# Patient Record
Sex: Female | Born: 1965 | Race: White | Hispanic: No | Marital: Married | Smoking: Never smoker
Health system: Southern US, Community
[De-identification: ages and names within clinical notes are randomized; demographics above are authoritative.]

## PROBLEM LIST (undated history)

## (undated) DIAGNOSIS — E785 Hyperlipidemia, unspecified: Secondary | ICD-10-CM

## (undated) HISTORY — PX: TMJ ARTHROSCOPY: SHX1067

## (undated) HISTORY — PX: ABDOMINAL HYSTERECTOMY: SHX81

---

## 2000-06-10 ENCOUNTER — Other Ambulatory Visit: Admission: RE | Admit: 2000-06-10 | Discharge: 2000-06-10 | Payer: Self-pay | Admitting: Obstetrics & Gynecology

## 2000-10-24 ENCOUNTER — Encounter (INDEPENDENT_AMBULATORY_CARE_PROVIDER_SITE_OTHER): Payer: Self-pay | Admitting: Specialist

## 2000-10-24 ENCOUNTER — Ambulatory Visit (HOSPITAL_BASED_OUTPATIENT_CLINIC_OR_DEPARTMENT_OTHER): Admission: RE | Admit: 2000-10-24 | Discharge: 2000-10-24 | Payer: Self-pay | Admitting: Surgery

## 2001-11-10 ENCOUNTER — Other Ambulatory Visit: Admission: RE | Admit: 2001-11-10 | Discharge: 2001-11-10 | Payer: Self-pay | Admitting: Obstetrics & Gynecology

## 2003-03-30 ENCOUNTER — Other Ambulatory Visit: Admission: RE | Admit: 2003-03-30 | Discharge: 2003-03-30 | Payer: Self-pay | Admitting: Obstetrics & Gynecology

## 2004-05-09 ENCOUNTER — Other Ambulatory Visit: Admission: RE | Admit: 2004-05-09 | Discharge: 2004-05-09 | Payer: Self-pay | Admitting: Obstetrics & Gynecology

## 2005-06-12 ENCOUNTER — Other Ambulatory Visit: Admission: RE | Admit: 2005-06-12 | Discharge: 2005-06-12 | Payer: Self-pay | Admitting: Obstetrics & Gynecology

## 2007-05-06 ENCOUNTER — Ambulatory Visit (HOSPITAL_COMMUNITY): Admission: RE | Admit: 2007-05-06 | Discharge: 2007-05-06 | Payer: Self-pay | Admitting: Obstetrics and Gynecology

## 2007-05-13 ENCOUNTER — Ambulatory Visit: Admission: RE | Admit: 2007-05-13 | Discharge: 2007-05-13 | Payer: Self-pay | Admitting: Gynecology

## 2007-05-20 ENCOUNTER — Inpatient Hospital Stay (HOSPITAL_COMMUNITY): Admission: RE | Admit: 2007-05-20 | Discharge: 2007-05-22 | Payer: Self-pay | Admitting: Obstetrics & Gynecology

## 2007-05-20 ENCOUNTER — Encounter: Payer: Self-pay | Admitting: Gynecology

## 2009-02-04 ENCOUNTER — Ambulatory Visit: Payer: Self-pay | Admitting: Diagnostic Radiology

## 2009-02-04 ENCOUNTER — Ambulatory Visit (HOSPITAL_BASED_OUTPATIENT_CLINIC_OR_DEPARTMENT_OTHER): Admission: RE | Admit: 2009-02-04 | Discharge: 2009-02-04 | Payer: Self-pay | Admitting: Family Medicine

## 2011-01-21 ENCOUNTER — Encounter: Payer: Self-pay | Admitting: Obstetrics and Gynecology

## 2011-05-15 NOTE — Op Note (Signed)
NAME:  Mary Zimmerman, Mary Zimmerman             ACCOUNT NO.:  [id number]   MEDICAL RECORD NO.:  [id number]          PATIENT TYPE:  INP   LOCATION:  0008                         FACILITY:  [facility]   PHYSICIAN:  De Blanch, M.D.DATE OF BIRTH:  [birth year]   DATE OF PROCEDURE:  05/20/2007  DATE OF DISCHARGE:                               OPERATIVE REPORT   PREOPERATIVE DIAGNOSES:  Complex adnexal mass, elevated CA125.   POSTOPERATIVE DIAGNOSES:  Endometriosis with bilateral ovarian  endometriomas, uterine fibroids.   OPERATIVE PROCEDURE:  Total abdominal hysterectomy, bilateral salpingo-  oophorectomy.   SURGEON:  De Blanch, M.D.   ASSISTANT:  Gerrit Friends. Aldona Bar, M.D.  Telford Nab, R.N.   ANESTHESIA:  General with orotracheal tube.   ESTIMATED BLOOD LOSS:  100 mL.   SURGICAL FINDINGS:  At the time of exploratory laparotomy, the patient  had bilateral endometriomas.  The left endometrioma was slightly  adherent to the left pelvic side wall.  The uterus is retroverted and  had small uterine fibroids.  There were additional adhesions in the  posterior cul-de-sac.  Frozen section returned showing these to be  bilateral endometriomas with no evidence of malignancy.   The upper abdomen was normal on palpation.   DESCRIPTION OF PROCEDURE:  The patient was brought to the operating room  and after satisfactory attainment of general anesthesia, was placed in  the modified lithotomy position in Agnew stirrups.  The anterior  abdominal wall, perineum and vagina were prepped with Betadine, a Foley  catheter was inserted and the patient was draped.  The abdomen was  entered through a Pfannenstiel incision.  Peritoneal washings were  obtained from the pelvis.  The upper abdomen was explored with the above  noted findings.  Bookwalter retractor was positioned.  Small bowel was  packed out of the pelvis.  The left ovarian cyst was most apparent and  therefore the left pelvic side  wall was opened, identifying the vessels  and ureter.  The ovarian vessels were skeletonized, clamped, cut, free  tied and suture ligated with 2-0 Vicryl suture.  The broad ligament was  incised above the ureter down to the uterus.  The fallopian tube and  ovarian ligament were cross clamped and then transected.  The tube and  ovary on the left were submitted for frozen section.  The uterus was  grasped with Tresa Endo clamp.  The right round ligament was divided and  right retroperitoneal space opened.  The right ovarian vessels were  skeletonized, clamped, cut, free tied and suture ligated.  In a similar  fashion the fallopian tube and ovarian ligament were cross clamped and  divided and the right tube and ovary handed off the operative field and  submitted to frozen section.  The bladder flap was advanced with sharp  and blunt dissection.  The uterine vessels were skeletonized, clamped,  cut and suture ligated.  The rectovaginal septum was incised in order to  mobilize the cervix further.  Then in a step wise fashion the  paracervical and cardinal ligaments were clamped, cut and suture  ligated.  The vaginal angles were cross clamped and the  vagina  transected from its connection to the cervix.  The vaginal angles were  transfixed with 0 Vicryl and the central portion of the vagina closed  with interrupted figure-of-eight sutures of 0 Vicryl.  The pelvis was  irrigated and reinspected and found to be hemostatic.  Packs and  retractors were removed.  The parietoperitoneum was closed using running  suture of 2-0 Vicryl suture.  The fascia was closed with a running  suture of 0 PDS.  Subcutaneous tissue was irrigated, hemostasis achieved  with cautery.  Skin was closed with a running subcuticular suture of 4-0  Vicryl.  A dressing was applied.  The patient was awakened from  anesthesia and taken to the recovery room in satisfactory condition.  Sponge, needle and instrument count correct  x2.      De Blanch, M.D.  Electronically Signed     DC/MEDQ  D:  05/20/2007  T:  05/20/2007  Job:  [id number]   cc:   Gerrit Friends. Aldona Bar, M.D.  Fax: [phone]   Telford Nab, R.N.  [address]  [place], [place] [address]

## 2011-05-15 NOTE — Consult Note (Signed)
NAME:  Mary Zimmerman, Mary Zimmerman             ACCOUNT NO.:  [id number]   MEDICAL RECORD NO.:  [id number]          PATIENT TYPE:  OUT   LOCATION:  GYN                          FACILITY:  [facility]   PHYSICIAN:  De Blanch, M.D.DATE OF BIRTH:  [birth year]   DATE OF CONSULTATION:  05/13/2007  DATE OF DISCHARGE:                                 CONSULTATION   ONCOLOGY CLINIC   CHIEF COMPLAINT:  Pelvic mass and low back pain.   HISTORY OF PRESENT ILLNESS:  This is a 45 year old white married female  seen in consultation at the request of Dr. Annamaria Helling regarding  management of a newly diagnosed pelvic mass.  The patient developed a  constellation of symptoms which were vague yet led her to worry that she  might have ovarian cancer.  These symptoms included low back pain for  the past two months.  This is especially located on the left.  Gas,  fatigue and epigastric pressure.   The patient has no other significant gynecologic history and has regular  menstrual periods.  Only symptoms associated with menstrual periods are  headaches and occasional dysmenorrhea.  The patient's evaluations  include a CT scan which shows an 11 cm cystic pelvic mass and a small  amount of free fluid.  There is no evidence of adenopathy.  She does  have a CA-125 which is 94 units/ml.   PAST MEDICAL HISTORY:  Medical illnesses:  None.   PAST SURGICAL HISTORY:  Knee reconstruction, excision of cervical and  axillary lymph nodes and resection of the tail of Spence in the right  axilla.   ALLERGIES:  Drug allergies:  Possibly penicillin (this is uncertain).   CURRENT MEDICATIONS:  Ponstel p.r.n. pain.   SOCIAL HISTORY:  The patient is married.  She does not smoke.  She works  in a bank.   Her primary hobby is horseback riding.   OBSTETRICAL HISTORY:  Gravida 2.  The patient has an 25 and a 16-year-  old.  The 29 year old daughter is beginning [employer] [address] in the  summer.   FAMILY HISTORY:  The  patient's mother died of breast cancer at age 56.  She also had a great aunt who had breast cancer and a maternal aunt who  had colon cancer in her 9s.   REVIEW OF SYSTEMS:  Ten point comprehensive review of systems is  negative except as noted above.   PHYSICAL EXAMINATION:  Height 5 foot 8, weight 140 pounds.  Blood  pressure 110/70.  Pulse 80.  Respiratory rate 20.  In general the  patient is a healthy, slender, white female in no acute distress.  HEENT  is negative.  Neck is supple without thyromegaly.  There is no  supraclavicular or inguinal adenopathy.  The abdomen is soft and  nontender.  There is a palpable mass in the suprapubic region arising  approximately 3-4 cm above the pubis.  Pelvic exam:  EG/BUS, vagina and  bladder urethra are normal.  Cervix is anterior, normal shape, size and  consistency.  On bimanual examination the uterus is anterior and then  behind the uterus and  to the left is an approximately 8-10 cm cystic  mass which is minimally tender.  Rectovaginal exam confirms.   IMPRESSION:  Complex pelvic mass with an elevated CA-125.  I would  recommend the patient undergo exploratory laparotomy with resection of  the mass with intraoperative frozen section to determine the histologic  nature.   I had a lengthy discussion with the patient and her husband regarding  the possibilities.  If this is benign, the patient wishes to preserve  her other ovary and uterus.  On the other hand, if it is malignant she  understands we will undertake a TAH BSO, omentectomy, pelvic inferior  lymphadenectomy, multiple peritoneal biopsies and washings.   We have agreed to proceed through a Pfannenstiel incision, although the  patient understands that should I need additional room to accomplish  surgical procedures as appropriate, we may also add a midline incision  if necessary.  The risks of surgery including hemorrhage, infection,  injury to adjacent viscera, thromboembolic  complications and anesthetic  risks are outlined.  The patient wished to go ahead with surgery and we  will coordinate the surgery with Dr. Annamaria Helling.      De Blanch, M.D.  Electronically Signed     DC/MEDQ  D:  05/13/2007  T:  05/13/2007  Job:  [id number]   cc:   Gerrit Friends. Aldona Bar, M.D.  Fax: [phone]   Telford Nab, R.N.  [address]  [place], [place] [address]

## 2011-05-15 NOTE — Discharge Summary (Signed)
NAMELEIANN, SPORER             ACCOUNT NO.:  [id number]   MEDICAL RECORD NO.:  [id number]          PATIENT TYPE:  INP   LOCATION:  1526                         FACILITY:  [facility]   PHYSICIAN:  Gerrit Friends. Aldona Bar, M.D.   DATE OF BIRTH:  04-17-1966   DATE OF ADMISSION:  05/20/2007  DATE OF DISCHARGE:  05/22/2007                               DISCHARGE SUMMARY   DISCHARGE DIAGNOSES:  1. Severe pelvic endometriosis with bilateral endometriomas.  2. Mucinous cystadenoma, left ovary.   PROCEDURE:  Total abdominal hysterectomy, bilateral salpingo-  oophorectomy and lysis of adhesions.   HISTORY:  This 45 year old female was seen by my physicians assistant in  early May with pelvic pain and was found to have a large pelvic mass  consistent with an enlarged ovary.  Ultimately, she had a CA-125 and a  CT and was referred to Dr. De Blanch for evaluation and  consideration for surgery.  After a normal preoperative workup she was  taken to the operating room by Dr. De Blanch on May 20, 2007, at which time she was found to have severe pelvic endometriosis -  relatively compatible with her past history - and underwent a total  abdominal hysterectomy, bilateral salpingo-oophorectomy and lysis of  adhesions.  Frozen section was done, revealing bilateral endometriomas  and on final sections, an additional mucinous cystadenoma of the left  ovary was discovered.   The patient's postoperative course was totally benign.  She remained  afebrile during her postop course.  Her discharge hemoglobin was 8.9  with a white count of 12,600 and a platelet count of 216,000  (preoperative hemoglobin was 10.8).   As mentioned, the patient's hospital course was benign.  On the morning  of May 22, 2007 she was ambulating well, tolerating a regular diet well,  having normal bowel and bladder function, was afebrile.  Her wound was  clean and dry and she was very comfortable with her oral  analgesia.   She was given all appropriate instructions on the morning of May 22, 2007 and discharged to home.  She will be seen by me in the office in  approximately 3 weeks' time, or as needed.   DISCHARGE MEDICATIONS:  1. Dilaudid 2 mg 1 every 3 hours as needed for severe pain.  2. Motrin 600 mg every 6 hours as needed for pain.  3. Premarin 0.625 mg daily.  4. She will continue/resume her iron medication on a daily basis once      her bowel function has returned to being completely normal.   She will remove her Steri-Strips on Sunday (she had a subcuticular  closure).   As mentioned, pathology was totally benign, consistent with severe  pelvic endometriosis and an incidental mucinous cystadenoma of the left  ovary.   CONDITION ON DISCHARGE:  Improved.      Gerrit Friends. Aldona Bar, M.D.  Electronically Signed     RMW/MEDQ  D:  05/22/2007  T:  05/22/2007  Job:  [id number]   cc:   De Blanch, M.D.  [address]  [place]  [place] [address]

## 2011-05-18 NOTE — Op Note (Signed)
. [facility]  Patient:    Mary Zimmerman, Mary Zimmerman                      MRN: [id number] Proc. Date: 10/24/00 Adm. Date:  [id number] Attending:  Charlton Haws CC:         Gerrit Friends. Aldona Bar, M.D.   Operative Report  CCS [phone]  PREOPERATIVE DIAGNOSIS:  Right axillary mass.  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  OPERATION:  SURGEON:  Currie Paris, M.D.  ANESTHESIA:  General  CLINICAL HISTORY: This patient is a 45 year old who has been troubled by intermittently noticeable mass in the right axilla fairly anterior right at the pectoral edge, quite difficult to fill from time to time.  Seems to fluctuate with her menstrual cycle.  Evaluation both by myself and another surgeon prior to her moving here had been unremarkable and it was unclear whether this was some sort of subcutaneous cyst, small lymph node or perhaps an aberrant breast tissue.  It was decided to excise it because it was not clear what it was and because it was symptomatic.  Since it fluctuated with her menstrual cycle, we elected to schedule it for a time that it would be most noticeable.  DESCRIPTION OF PROCEDURE:  The patient was seen in the holding area and the area could be identified and was actually more prominent today than when seen in my office.  She was then taken to the operating room and prior to any anesthesia, the mass was again identified and marked and we tried various positions to make sure positioning would not make the mass more difficult to find or feel.  It appeared best with her tilted a little bit right side up and with her arm draped up over her head to relax the pectoral muscles. The area was marked with her in that position.  The patient then underwent satisfactory general endotracheal anesthesia.  She was then positioned appropriately with her arm supported on a bar and  wrapped with foam and held in place with Curlex gently with no sharp angles.  The axillary was then prepped and draped.  I could palpate the mass and made an incision directly over it. It appeared to be a nodular breast tissue and the skin was freed up going a little bit higher into the axilla so I could be above the area and then grasped it with an Allis and excised it.  There was also a small lymph node associated with this and I think the area that she was feeling today was primarily this breast tissue which was fairly prominent. Once this area was excised, the area was checked for hemostasis.  I injected 0.25% Marcaine for postoperative analgesia.  The area was carefully palpated. There was no other thickened areas or masses. No axillary adenopathy could be noticed.  Specimen was sent to pathology.  The wound was checked again for hemostasis and then closed with some 3-0 Vicryl followed by 4-0 Monocryl subcuticular plus Steri-Strips.  The patient tolerated the procedure well. There were no operative complications.  All counts were correct. DD:  10/24/00 TD:  10/24/00 Job: [id number]

## 2015-11-18 ENCOUNTER — Ambulatory Visit: Payer: Self-pay | Admitting: Surgery

## 2015-11-18 NOTE — H&P (Signed)
History of Present Illness Mary Zimmerman. Gloria Lambertson MD; 11/18/2015 9:30 AM) The patient is a 49 year old female who presents with a breast mass. PCP - Dr. Annamaria Helling for evaluation of right breast mass  This is a 49 year old female in good health who presents with about 6 months of a palpable mass in her medial right breast. She had a mammogram in February that was completely normal. She had a right diagnostic mammogram and ultrasound performed on 07/18/15 at [facility] which showed a 6 mm lobulated lesion at 3:00. This was felt to probably be benign. The mass persisted and she had a follow-up ultrasound on 8/15 that showed no significant change. The patient has a strong family history as her mother passed away of metastatic breast cancer at age 8. 15 years ago she had right axillary lymph node biopsy and excision of accessory breast tissue by Dr. Jamey Ripa. The pathology was all benign.  Menarche 12 First pregnancy 22 Breast feed yes Hormones - 7 years   Other Problems Mary Zimmerman, CMA; 11/18/2015 9:05 AM) Back Pain Hemorrhoids Lump In Breast Oophorectomy  Past Surgical History Mary Zimmerman, CMA; 11/18/2015 9:05 AM) Hysterectomy (not due to cancer) - Complete Knee Surgery Left. Oral Surgery  Diagnostic Studies History Mary Zimmerman, [place]; 11/18/2015 9:05 AM) Colonoscopy 1-5 years ago Mammogram within last year Pap Smear 1-5 years ago  Allergies Mary Zimmerman, CMA; 11/18/2015 9:05 AM) No Known Drug Allergies 11/18/2015  Medication History Mary Zimmerman, CMA; 11/18/2015 9:06 AM) Estradiol (  Tablet, Oral) Active. Medications Reconciled  Social History Mary Zimmerman, CMA; 11/18/2015 9:05 AM) Alcohol use Occasional alcohol use. Caffeine use Carbonated beverages, Coffee. No drug use Tobacco use Never smoker.  Family History Mary Zimmerman, [place]; 11/18/2015 9:05 AM) Breast Cancer Mother. Cancer Mother. Depression Mother. Heart Disease Father. Heart disease in female  family member before age 94 Hypertension Father. Prostate Cancer Father.  Pregnancy / Birth History Mary Zimmerman, CMA; 11/18/2015 9:05 AM) Age at menarche 12 years. Gravida 2 Maternal age 80-25 Para 2     Review of Systems Mary Zimmerman CMA; 11/18/2015 9:05 AM) General Not Present- Appetite Loss, Chills, Fatigue, Fever, Night Sweats, Weight Gain and Weight Loss. Skin Not Present- Change in Wart/Mole, Dryness, Hives, Jaundice, New Lesions, Non-Healing Wounds, Rash and Ulcer. HEENT Present- Wears glasses/contact lenses. Not Present- Earache, Hearing Loss, Hoarseness, Nose Bleed, Oral Ulcers, Ringing in the Ears, Seasonal Allergies, Sinus Pain, Sore Throat, Visual Disturbances and Yellow Eyes. Respiratory Not Present- Bloody sputum, Chronic Cough, Difficulty Breathing, Snoring and Wheezing. Breast Present- Breast Mass. Not Present- Breast Pain, Nipple Discharge and Skin Changes. Cardiovascular Not Present- Chest Pain, Difficulty Breathing Lying Down, Leg Cramps, Palpitations, Rapid Heart Rate, Shortness of Breath and Swelling of Extremities. Gastrointestinal Not Present- Abdominal Pain, Bloating, Bloody Stool, Change in Bowel Habits, Chronic diarrhea, Constipation, Difficulty Swallowing, Excessive gas, Gets full quickly at meals, Hemorrhoids, Indigestion, Nausea, Rectal Pain and Vomiting. Female Genitourinary Not Present- Frequency, Nocturia, Painful Urination, Pelvic Pain and Urgency. Musculoskeletal Present- Joint Stiffness. Not Present- Back Pain, Joint Pain, Muscle Pain, Muscle Weakness and Swelling of Extremities. Neurological Not Present- Decreased Memory, Fainting, Headaches, Numbness, Seizures, Tingling, Tremor, Trouble walking and Weakness. Psychiatric Not Present- Anxiety, Bipolar, Change in Sleep Pattern, Depression, Fearful and Frequent crying. Endocrine Not Present- Cold Intolerance, Excessive Hunger, Hair Changes, Heat Intolerance, Hot flashes and New Diabetes. Hematology  Not Present- Easy Bruising, Excessive bleeding, Gland problems, HIV and Persistent Infections.  Vitals Mary Zimmerman CMA; 11/18/2015 9:06 AM) 11/18/2015 9:06 AM Weight: 136.8 lb  Height: 68in Body Surface Area: 1.74 m Body Mass Index: 20.8 kg/m  Temp.: 97.4F(Temporal)  Pulse: 57 (Regular)  BP: 126/74 (Sitting, Left Arm, Standard)      Physical Exam Molli Hazard(Trenia Tennyson K. Issa Kosmicki MD; 11/18/2015 9:31 AM)  The physical exam findings are as follows: Note:WDWN in NAD HEENT: EOMI, sclera anicteric Neck: No masses, no thyromegaly Lungs: CTA bilaterally; normal respiratory effort Breasts: left breast - no nipple retraction or discharge; no palpable masses or lymphadenopathy Right breast - no nipple retraction or discharge; healed right axillary incision; no lymphadenopathy; at 3:00 just outside the areola, there is an oval-shaped 1cm mass that is smooth and mobile. No overlying skin changes CV: Regular rate and rhythm; no murmurs Abd: +bowel sounds, soft, non-tender, no masses Ext: Well-perfused; no edema Skin: Warm, dry; no sign of jaundice    Assessment & Plan Molli Hazard(Ellamae Lybeck K. Naod Sweetland MD; 11/18/2015 9:26 AM)  BREAST MASS, RIGHT (N63)  Current Plans Schedule for Surgery - Right breast lumpectomy. The surgical procedure has been discussed with the patient. Potential risks, benefits, alternative treatments, and expected outcomes have been explained. All of the patient's questions at this time have been answered. The likelihood of reaching the patient's treatment goal is good. The patient understand the proposed surgical procedure and wishes to proceed.  Mary ArmsMatthew K. Corliss Skainssuei, MD, [facility]  General/ Trauma Surgery  11/18/2015 9:32 AM

## 2016-01-12 ENCOUNTER — Other Ambulatory Visit: Payer: Self-pay | Admitting: Surgery

## 2020-02-10 ENCOUNTER — Other Ambulatory Visit: Payer: Self-pay

## 2020-02-10 ENCOUNTER — Encounter: Payer: Self-pay | Admitting: Family Medicine

## 2020-02-10 ENCOUNTER — Ambulatory Visit (INDEPENDENT_AMBULATORY_CARE_PROVIDER_SITE_OTHER): Payer: Self-pay | Admitting: Family Medicine

## 2020-02-10 VITALS — BP 126/52 | HR 55

## 2020-02-10 DIAGNOSIS — R1032 Left lower quadrant pain: Secondary | ICD-10-CM

## 2020-02-10 DIAGNOSIS — M26622 Arthralgia of left temporomandibular joint: Secondary | ICD-10-CM | POA: Insufficient documentation

## 2020-02-10 DIAGNOSIS — Z803 Family history of malignant neoplasm of breast: Secondary | ICD-10-CM

## 2020-02-10 DIAGNOSIS — T7020XA Unspecified effects of high altitude, initial encounter: Secondary | ICD-10-CM | POA: Insufficient documentation

## 2020-02-10 DIAGNOSIS — E785 Hyperlipidemia, unspecified: Secondary | ICD-10-CM | POA: Insufficient documentation

## 2020-02-10 DIAGNOSIS — Z8249 Family history of ischemic heart disease and other diseases of the circulatory system: Secondary | ICD-10-CM | POA: Insufficient documentation

## 2020-02-10 DIAGNOSIS — N63 Unspecified lump in unspecified breast: Secondary | ICD-10-CM | POA: Insufficient documentation

## 2020-02-10 DIAGNOSIS — G44229 Chronic tension-type headache, not intractable: Secondary | ICD-10-CM | POA: Insufficient documentation

## 2020-02-10 DIAGNOSIS — M1990 Unspecified osteoarthritis, unspecified site: Secondary | ICD-10-CM | POA: Insufficient documentation

## 2020-02-10 DIAGNOSIS — N951 Menopausal and female climacteric states: Secondary | ICD-10-CM | POA: Insufficient documentation

## 2020-02-10 DIAGNOSIS — T753XXA Motion sickness, initial encounter: Secondary | ICD-10-CM | POA: Insufficient documentation

## 2020-02-10 DIAGNOSIS — R519 Headache, unspecified: Secondary | ICD-10-CM | POA: Insufficient documentation

## 2020-02-10 DIAGNOSIS — M62830 Muscle spasm of back: Secondary | ICD-10-CM | POA: Insufficient documentation

## 2020-02-10 NOTE — Progress Notes (Signed)
Office Visit Note   Patient: Mary Zimmerman           Date of Birth: Oct 14, 1966           MRN: [id number] Visit Date: 02/10/2020 Requested by: No referring provider defined for this encounter. PCP: Lavada Mesi, MD  Subjective: Chief Complaint  Patient presents with  . establish PCP    HPI: She is here to establish care.  She has not had a PCP in quite a while.  She goes to her gynecologist on a regular basis for breast cancer screening.  For the past few months she has had intermittent left lower abdominal discomfort.  She thought it might be due to a food sensitivity but has not been able to pinpoint anything in particular.  When it happens, it it is usually relieved after having a bowel movement.  It is usually associated with increased abdominal gas.  She has not had any blood in her stools.  She had some sort of a sigmoidoscopy test a few years ago which was positive for hemorrhoids.  It was not a full colonoscopy.  She has a family history of colon cancer.  Denies any unintentional weight change.  She has chronic tension type headaches in the occipital area.  She has excellent relief with chiropractic treatments per Dr. Stephannie Peters.  Symptoms are very manageable.  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  Apparently it has failed, and she occasionally has flareups of pain for which she uses Flexeril with good results.  When she has annual blood testing through her work, her cholesterol is usually slightly elevated but not high enough to warrant treatment.  She does have a family history of heart disease in her father starting in his late 45s.  Her sister had some sort of a stroke related to the arteries in her neck.  She plans to find out more detail about her condition.  Patient has been healthy overall, she is a non-smoker, exercises regularly and eats healthfully.  She had hysterectomy related to endometriosis about 15 years ago.    ROS:   All other systems were reviewed and are negative.  Objective: Vital Signs: BP (!) 126/52   Pulse (!) 55   Physical Exam:  General:  Alert and oriented, in no acute distress. Pulm:  Breathing unlabored. Psy:  Normal mood, congruent affect. Skin: No visible rash or lesions. CV: Regular rate and rhythm without murmurs, rubs, or gallops.  No peripheral edema.  2+ radial and posterior tibial pulses. Lungs: Clear to auscultation throughout with no wheezing or areas of consolidation. Neck: No thyromegaly or nodules, no carotid bruits, 2+ pulses. Abd: Bowel sounds are active, no hepatosplenomegaly or masses.  Soft and nontender.  No audible bruits.  No evidence of ascites.  Imaging: None today  Assessment & Plan: 1.  Intermittent left lower abdominal pain, etiology uncertain. -We will try a food elimination diet.  She might benefit from probiotics.  She will increase fluid intake and fiber intake. -We will order Cologuard screening.  2.  Chronic intermittent headaches, well controlled.  3.  Hyperlipidemia with family history of heart disease -If her next blood work is abnormal, we will order fractionated lipid profile.  If that is abnormal, then CT calcium score.  4.  Family history of breast cancer -Continue annual screening per gynecology.      Procedures: No procedures performed  No notes on file     PMFS History:  Patient Active Problem List   Diagnosis Date Noted  . Breast lump 02/10/2020  . Effects of high altitude 02/10/2020  . Headache 02/10/2020  . Menopausal symptom 02/10/2020  . Menopausal syndrome 02/10/2020  . Motion sickness 02/10/2020  . Osteoarthritis 02/10/2020  . Spasm of back muscles 02/10/2020  . Chronic tension-type headache, not intractable 02/10/2020  . Arthralgia of left temporomandibular joint 02/10/2020  . Family history of breast cancer 02/10/2020  . Family history of heart disease 02/10/2020  . Hyperlipidemia 02/10/2020  . Migraine  04/01/2012   History reviewed. No pertinent past medical history.  Family History  Problem Relation Age of Onset  . Cancer Mother   . Breast cancer Mother   . Colon polyps Father   . Congestive Heart Failure Father   . Heart attack Father 83       Smoker  . Colon cancer Maternal Aunt   . Cancer Maternal Aunt     History reviewed. No pertinent surgical history. Social History   Occupational History  . Not on file  Tobacco Use  . Smoking status: Not on file  Substance and Sexual Activity  . Alcohol use: Not on file  . Drug use: Not on file  . Sexual activity: Not on file

## 2020-02-26 ENCOUNTER — Encounter: Payer: Self-pay | Admitting: Family Medicine

## 2020-02-26 ENCOUNTER — Other Ambulatory Visit: Payer: Self-pay | Admitting: Family Medicine

## 2020-02-26 DIAGNOSIS — R1032 Left lower quadrant pain: Secondary | ICD-10-CM

## 2020-03-01 ENCOUNTER — Encounter: Payer: Self-pay | Admitting: Family Medicine

## 2020-03-01 ENCOUNTER — Other Ambulatory Visit: Payer: Self-pay

## 2020-03-01 ENCOUNTER — Ambulatory Visit (INDEPENDENT_AMBULATORY_CARE_PROVIDER_SITE_OTHER): Payer: BC Managed Care – PPO | Admitting: Family Medicine

## 2020-03-01 DIAGNOSIS — M25521 Pain in right elbow: Secondary | ICD-10-CM | POA: Diagnosis not present

## 2020-03-01 MED ORDER — NITROGLYCERIN 0.1 MG/HR TD PT24: MEDICATED_PATCH | TRANSDERMAL | 3 refills | Status: DC

## 2020-03-01 MED ORDER — DICLOFENAC SODIUM 75 MG PO TBEC: 75.0000 mg | DELAYED_RELEASE_TABLET | Freq: Two times a day (BID) | ORAL | 3 refills | Status: DC | PRN

## 2020-03-01 NOTE — Progress Notes (Signed)
I saw and examined the patient with Dr. Robby Sermon and agree with assessment and plan as outlined.    Right lateral elbow pain for about 6 weeks.  Doing Tae-Bo workouts, possibly started having pain from punching?  Exam shows full ROM, no pain at radial tunnel.  Point-tender at common extensor tendon.  Will try nitro-glycerine patches, diclofenac.  Home stretches.  Already using strap.  PT or injection if worsens.

## 2020-03-01 NOTE — Progress Notes (Signed)
Mary Zimmerman - 54 y.o. female MRN [id number]  Date of birth: August 04, 1966  Office Visit Note: Visit Date: 03/01/2020 PCP: Eunice Blase, MD Referred by: Eunice Blase, MD  Subjective: Chief Complaint  Patient presents with  . Right Elbow - Pain   HPI: Mary Zimmerman is a 54 y.o. female who comes in today with acute right sided elbow pain for the past 3 days.  She reports that she started having lateral elbow pain recently after starting Tae Bo workouts for the past 6 weeks ago. The pain acutely worsened 3 days ago when she was lifting a bail of hay. Yesterday, it hurt to bend right elbow. Today, range of motion has improved. No numbness or tingling in fingers. She has had tennis elbow in her left elbow last year which felt similar and took 10 months to fully heal. Thus far, she has been icing, wearing elbow band, and taking ibuprofen which helps some.   ROS Otherwise per HPI.  Assessment & Plan: Visit Diagnoses:  1. Pain in right elbow     Plan: Suspect lateral epicondylitis on right side, aggravated by punching with Hiram Comber for the past 6 weeks. Will start with PO voltaren BID PRN, icing, band, and relative rest in addition to stretching. Will also trial nitroglycerin patch protocol to try to expedite healing process. May try topical voltaren as well. Discussed trialing prolotherapy if it does not improve.   Meds & Orders:  Meds ordered this encounter  Medications  . diclofenac (VOLTAREN) 75 MG EC tablet    Sig: Take 1 tablet (75 mg total) by mouth 2 (two) times daily as needed.    Dispense:  60 tablet    Refill:  3  . nitroGLYCERIN (NITRO-DUR) 0.1 mg/hr patch    Sig: Apply 1/4 patch to affected area 12 hours daily    Dispense:  30 patch    Refill:  3   No orders of the defined types were placed in this encounter.   Follow-up: As needed  Procedures: No procedures performed  No notes on file   Clinical History: No specialty comments available.   She has no  history on file for tobacco. No results for input(s): HGBA1C, LABURIC in the last 8760 hours.  Objective:  VS:  HT:    WT:   BMI:     BP:   HR: bpm  TEMP: ( )  RESP:  Physical Exam  PHYSICAL EXAM: Gen: NAD, alert, cooperative with exam, well-appearing HEENT: clear conjunctiva,  CV:  no edema, capillary refill brisk, normal rate Resp: non-labored Skin: no rashes, normal turgor  Neuro: no gross deficits.  Psych:  alert and oriented  Ortho Exam  Right Elbow: - Inspection: no obvious deformity. No swelling, erythema or bruising - Palpation: TTP over lateral epicondyle  - ROM: full active ROM in flexion and extension. No crepitus. Pain with passive wrist flexion with elbow at full extension.  - Strength: 5/5 strength in wrist flexion. 5/5 wrist extension (with pain). 5/5 strength in biceps, triceps.  - Neuro: NV intact distally - Special testing: Mild pain over lateral epicondyle with resisted ECRB. Pain with resisted supination. Negative tinel's at radial tunnel  Imaging: No results found.  Past Medical/Family/Surgical/Social History: Medications & Allergies reviewed per EMR, new medications updated. Patient Active Problem List   Diagnosis Date Noted  . Breast lump 02/10/2020  . Effects of high altitude 02/10/2020  . Headache 02/10/2020  . Menopausal symptom 02/10/2020  . Menopausal syndrome 02/10/2020  .  Motion sickness 02/10/2020  . Osteoarthritis 02/10/2020  . Spasm of back muscles 02/10/2020  . Chronic tension-type headache, not intractable 02/10/2020  . Arthralgia of left temporomandibular joint 02/10/2020  . Family history of breast cancer 02/10/2020  . Family history of heart disease 02/10/2020  . Hyperlipidemia 02/10/2020  . Migraine 04/01/2012   History reviewed. No pertinent past medical history. Family History  Problem Relation Age of Onset  . Cancer Mother   . Breast cancer Mother   . Colon polyps Father   . Congestive Heart Failure Father   . Heart  attack Father 61       Smoker  . Colon cancer Maternal Aunt   . Cancer Maternal Aunt   . Other Sister        Fibromuscular dysplasia   History reviewed. No pertinent surgical history. Social History   Occupational History  . Not on file  Tobacco Use  . Smoking status: Not on file  Substance and Sexual Activity  . Alcohol use: Not on file  . Drug use: Not on file  . Sexual activity: Not on file

## 2020-03-31 ENCOUNTER — Ambulatory Visit: Payer: Self-pay | Admitting: Gastroenterology

## 2020-04-05 ENCOUNTER — Encounter: Payer: Self-pay | Admitting: Family Medicine

## 2020-04-06 LAB — COLOGUARD: Cologuard: NEGATIVE

## 2020-04-08 LAB — COLOGUARD: COLOGUARD: NEGATIVE

## 2020-04-13 ENCOUNTER — Telehealth: Payer: Self-pay | Admitting: Family Medicine

## 2020-04-13 NOTE — Telephone Encounter (Signed)
Cologuard test was normal/negative.

## 2020-05-11 ENCOUNTER — Ambulatory Visit: Payer: Self-pay | Admitting: Gastroenterology

## 2020-06-27 ENCOUNTER — Other Ambulatory Visit: Payer: Self-pay | Admitting: Family Medicine

## 2020-09-28 ENCOUNTER — Other Ambulatory Visit: Payer: Self-pay | Admitting: Family Medicine

## 2020-10-19 ENCOUNTER — Emergency Department (HOSPITAL_BASED_OUTPATIENT_CLINIC_OR_DEPARTMENT_OTHER): Payer: BC Managed Care – PPO

## 2020-10-19 ENCOUNTER — Emergency Department (HOSPITAL_BASED_OUTPATIENT_CLINIC_OR_DEPARTMENT_OTHER)
Admission: EM | Admit: 2020-10-19 | Discharge: 2020-10-19 | Disposition: A | Discharge status: Home / Self Care | Payer: BC Managed Care – PPO | Attending: Emergency Medicine | Admitting: Emergency Medicine

## 2020-10-19 ENCOUNTER — Encounter (HOSPITAL_BASED_OUTPATIENT_CLINIC_OR_DEPARTMENT_OTHER): Payer: Self-pay | Admitting: *Deleted

## 2020-10-19 ENCOUNTER — Other Ambulatory Visit: Payer: Self-pay

## 2020-10-19 DIAGNOSIS — W5512XA Struck by horse, initial encounter: Secondary | ICD-10-CM | POA: Diagnosis not present

## 2020-10-19 DIAGNOSIS — S99922A Unspecified injury of left foot, initial encounter: Secondary | ICD-10-CM | POA: Diagnosis present

## 2020-10-19 DIAGNOSIS — M79672 Pain in left foot: Secondary | ICD-10-CM

## 2020-10-19 DIAGNOSIS — S9032XA Contusion of left foot, initial encounter: Secondary | ICD-10-CM | POA: Insufficient documentation

## 2020-10-19 MED ORDER — KETOROLAC TROMETHAMINE 60 MG/2ML IM SOLN
60.0000 mg | Freq: Once | INTRAMUSCULAR | Status: AC
  Administered 2020-10-19: 60 mg via INTRAMUSCULAR
  Filled 2020-10-19: qty 2

## 2020-10-19 NOTE — Discharge Instructions (Signed)
Thank you for letting us treat you in the ER today  Continue to wear the cam walker as needed for pain.  Please keep the leg elevated when possible.  Apply ice, take Tylenol or ibuprofen for pain.  Return to the ER for any new or worsening symptoms.  Please make sure to follow-up with your primary care doctor within the next few weeks.

## 2020-10-19 NOTE — ED Triage Notes (Signed)
C/o left foot injury x 30 mins ago

## 2020-10-19 NOTE — ED Provider Notes (Signed)
[facility] EMERGENCY DEPARTMENT Provider Note   CSN: [id number] Arrival date & time: 10/19/20  1508     History Chief Complaint  Patient presents with  . Foot Injury    Mary Zimmerman is a 54 y.o. female.  HPI 54 year old female with history of osteoarthritis, hyperlipidemia, migraines presents to the ER with complaints of left foot injury.  Patient states that a horse bucked and landed with one of his who was on her left foot.  She noted significant swelling to the left foot within minutes.  Denies any numbness or tingling to lower extremities.  Has been able to walk but with pain.    History reviewed. No pertinent past medical history.  Patient Active Problem List   Diagnosis Date Noted  . Breast lump 02/10/2020  . Effects of high altitude 02/10/2020  . Headache 02/10/2020  . Menopausal symptom 02/10/2020  . Menopausal syndrome 02/10/2020  . Motion sickness 02/10/2020  . Osteoarthritis 02/10/2020  . Spasm of back muscles 02/10/2020  . Chronic tension-type headache, not intractable 02/10/2020  . Arthralgia of left temporomandibular joint 02/10/2020  . Family history of breast cancer 02/10/2020  . Family history of heart disease 02/10/2020  . Hyperlipidemia 02/10/2020  . Migraine 04/01/2012    Past Surgical History:  Procedure Laterality Date  . ABDOMINAL HYSTERECTOMY       OB History   No obstetric history on file.     Family History  Problem Relation Age of Onset  . Cancer Mother   . Breast cancer Mother   . Colon polyps Father   . Congestive Heart Failure Father   . Heart attack Father 38       Smoker  . Colon cancer Maternal Aunt   . Cancer Maternal Aunt   . Other Sister        Fibromuscular dysplasia    Social History   Tobacco Use  . Smoking status: Never Smoker  Substance Use Topics  . Alcohol use: Not Currently  . Drug use: Not Currently    Home Medications Prior to Admission medications   Medication Sig Start Date End  Date Taking? Authorizing Provider  Ascorbic Acid (VITAMIN C ADULT GUMMIES PO) Take 500 mg by mouth.    [provider]  BIOTIN PO Take by mouth 2 (two) times daily.    [provider]  Cyanocobalamin (BL VITAMIN B-12 PO) Take by mouth daily.    [provider]  cyclobenzaprine (FLEXERIL) 10 MG tablet cyclobenzaprine 10 mg tablet  TAKE 1 TABLET BY MOUTH AT BEDTIME FOR MUSCLE SPASM IN NECK    [provider]  diclofenac (VOLTAREN) 75 MG EC tablet TAKE 1 TABLET(75 MG) BY MOUTH TWICE DAILY AS NEEDED 09/28/20   Hilts, Casimiro Needle, MD  estradiol (ESTRACE) 1 MG tablet estradiol 1 mg tablet  TAKE 1 TABLET BY MOUTH EVERY DAY    [provider]  nitroGLYCERIN (NITRO-DUR) 0.1 mg/hr patch Apply 1/4 patch to affected area 12 hours daily 03/01/20   Hilts, Michael, MD  Probiotic Product (PROBIOTIC DAILY PO) Take by mouth daily.    [provider]    Allergies    Patient has no known allergies.  Review of Systems   Review of Systems  Musculoskeletal: Positive for joint swelling.       Left foot pain   Skin: Positive for color change.  Neurological: Negative for weakness and numbness.    Physical Exam Updated Vital Signs BP 139/86   Pulse 73  Temp 98.5 F (36.9 C) (Oral)   Resp 18   Ht 5\' 8"  (1.727 m)   Wt 59 kg   SpO2 98%   BMI 19.77 kg/m   Physical Exam Vitals reviewed.  Constitutional:      General: She is not in acute distress.    Appearance: Normal appearance. She is not ill-appearing or diaphoretic.  HENT:     Head: Normocephalic and atraumatic.  Eyes:     General:        Right eye: No discharge.        Left eye: No discharge.     Extraocular Movements: Extraocular movements intact.     Conjunctiva/sclera: Conjunctivae normal.  Musculoskeletal:        General: Swelling, tenderness and signs of injury present. No deformity. Normal range of motion.     Right lower leg: No edema.     Left lower leg: No edema.     Comments: Left  foot with visible hematoma and bruising. No evidence of open fracture. 2+ DP pulses to left foot. Full ROM of all 5 toes   Skin:    Capillary Refill: Capillary refill takes less than 2 seconds.     Findings: Bruising and erythema present.  Neurological:     General: No focal deficit present.     Mental Status: She is alert and oriented to person, place, and time.     Sensory: No sensory deficit.     Motor: No weakness.  Psychiatric:        Mood and Affect: Mood normal.        Behavior: Behavior normal.     ED Results / Procedures / Treatments   Labs (all labs ordered are listed, but only abnormal results are displayed) Labs Reviewed - No data to display  EKG None  Radiology DG Foot Complete Left  Result Date: 10/19/2020 CLINICAL DATA:  Status post trauma. EXAM: LEFT FOOT - COMPLETE 3+ VIEW COMPARISON:  None. FINDINGS: There is no evidence of an acute fracture or dislocation. There is no evidence of arthropathy or other focal bone abnormality. Soft tissues are unremarkable. IMPRESSION: Negative. Electronically Signed   By: 10/21/2020 M.D.   On: 10/19/2020 15:41    Procedures Procedures (including critical care time)  Medications Ordered in ED Medications  ketorolac (TORADOL) injection 60 mg (has no administration in time range)    ED Course  I have reviewed the triage vital signs and the nursing notes.  Pertinent labs & imaging results that were available during my care of the patient were reviewed by me and considered in my medical decision making (see chart for details).    MDM Rules/Calculators/A&P                          54 year old female complains of left foot pain after being stepped on by a horse Neurovascularly intact.  No evidence of open fracture.  Plain films without evidence of fracture.  She does have a sizable hematoma/bruising.  Pain treated with Toradol, will provide cam walker boot.  Encouraged icing, elevation, Tylenol/ibuprofen for pain.   Encouraged PCP 54 follow-up.  Return precautions discussed.  She voiced understanding and is agreeable. Final Clinical Impression(s) / ED Diagnoses Final diagnoses:  Left foot pain    Rx / DC Orders ED Discharge Orders    None       Renae Fickle 10/19/20 1558    10/21/20, MD  10/19/20 2244  

## 2020-10-25 ENCOUNTER — Other Ambulatory Visit: Payer: Self-pay

## 2020-10-25 ENCOUNTER — Encounter: Payer: Self-pay | Admitting: Family Medicine

## 2020-10-25 ENCOUNTER — Ambulatory Visit (INDEPENDENT_AMBULATORY_CARE_PROVIDER_SITE_OTHER): Payer: BC Managed Care – PPO | Admitting: Family Medicine

## 2020-10-25 DIAGNOSIS — S9032XS Contusion of left foot, sequela: Secondary | ICD-10-CM

## 2020-10-25 NOTE — Progress Notes (Signed)
Office Visit Note   Patient: Mary Zimmerman           Date of Birth: 05-19-1966           MRN: [id number] Visit Date: 10/25/2020 Requested by: Lavada Mesi, MD [address] [place],  [place] [address] PCP: Lavada Mesi, MD  Subjective: Chief Complaint  Patient presents with  . Left Foot - Pain    Horse "pounced" on left foot 10/19/20. Went to [facility]. Had xrays and was placed in a short fx boot.    HPI: He is here with left foot pain. On October 28 horse stepped on top of her foot. Immediate severe pain, rapid swelling. She went to med [facility] where x-rays were negative for fracture. She was given a fracture boot and has been using ice and an Ace wrap. Her pain is improving but she still very tender. No previous fractures.              ROS:   All other systems were reviewed and are negative.  Objective: Vital Signs: There were no vitals taken for this visit.  Physical Exam:  General:  Alert and oriented, in no acute distress. Pulm:  Breathing unlabored. Psy:  Normal mood, congruent affect. Skin: There is resolving ecchymosis in the forefoot, lateral foot and around the lateral calcaneus.  Left foot: Slight swelling near the fourth and fifth TMT joints. She is very tender to palpation there. She is also tender from plantar approach near the fourth and fifth TMT joints. She is able to flex and extend her toes with intact tendon function. No significant pain with ankle dorsiflexion, eversion and plantarflexion against resistance.   Imaging: No x-rays today. Previous x-rays reviewed on computer.  Assessment & Plan: 1. 6 days status post left foot contusion, cannot rule out occult fracture versus bone bruise. -Continue with weightbearing as tolerated, start taking vitamin D3 and vitamin K2. Anticipate 2-3 more weeks healing time. If still having significant pain at that point, we will repeat three-view foot x-rays.     Procedures: No procedures  performed  No notes on file     PMFS History: Patient Active Problem List   Diagnosis Date Noted  . Breast lump 02/10/2020  . Effects of high altitude 02/10/2020  . Headache 02/10/2020  . Menopausal symptom 02/10/2020  . Menopausal syndrome 02/10/2020  . Motion sickness 02/10/2020  . Osteoarthritis 02/10/2020  . Spasm of back muscles 02/10/2020  . Chronic tension-type headache, not intractable 02/10/2020  . Arthralgia of left temporomandibular joint 02/10/2020  . Family history of breast cancer 02/10/2020  . Family history of heart disease 02/10/2020  . Hyperlipidemia 02/10/2020  . Migraine 04/01/2012   No past medical history on file.  Family History  Problem Relation Age of Onset  . Cancer Mother   . Breast cancer Mother   . Colon polyps Father   . Congestive Heart Failure Father   . Heart attack Father 62       Smoker  . Colon cancer Maternal Aunt   . Cancer Maternal Aunt   . Other Sister        Fibromuscular dysplasia    Past Surgical History:  Procedure Laterality Date  . ABDOMINAL HYSTERECTOMY     Social History   Occupational History  . Not on file  Tobacco Use  . Smoking status: Never Smoker  Substance and Sexual Activity  . Alcohol use: Not Currently  . Drug use: Not Currently  .  Sexual activity: Not on file

## 2021-03-08 ENCOUNTER — Encounter: Payer: Self-pay | Admitting: Family Medicine

## 2021-03-08 ENCOUNTER — Other Ambulatory Visit: Payer: Self-pay

## 2021-03-08 ENCOUNTER — Ambulatory Visit (INDEPENDENT_AMBULATORY_CARE_PROVIDER_SITE_OTHER): Payer: BC Managed Care – PPO | Admitting: Family Medicine

## 2021-03-08 DIAGNOSIS — R2232 Localized swelling, mass and lump, left upper limb: Secondary | ICD-10-CM

## 2021-03-08 DIAGNOSIS — M25562 Pain in left knee: Secondary | ICD-10-CM

## 2021-03-08 MED ORDER — CELECOXIB 200 MG PO CAPS: 200.0000 mg | ORAL_CAPSULE | Freq: Two times a day (BID) | ORAL | 6 refills | Status: AC | PRN

## 2021-03-08 MED ORDER — CEPHALEXIN 500 MG PO CAPS: 500.0000 mg | ORAL_CAPSULE | Freq: Three times a day (TID) | ORAL | 0 refills | Status: DC

## 2021-03-08 NOTE — Progress Notes (Signed)
Office Visit Note   Patient: Mary Zimmerman           Date of Birth: Apr 24, 1966           MRN: [id number] Visit Date: 03/08/2021 Requested by: Lavada Mesi, MD [address] [place],  [place] [address] PCP: Lavada Mesi, MD  Subjective: Chief Complaint  Patient presents with  . Left Knee - Pain    Pain medial aspect and inferior to the patella. H/o ACL repair in that knee.   . Other    Several small masses - 3 around left axilla/breast region and several more behind left ear.     HPI: She is here with several concerns.  She has been training for half marathon which will take place in about a month.  It will be a team event.  In the past several days her left knee has been hurting significantly.  She is status post ACL reconstruction years ago.  She recently got up into a truck and felt something happen in her knee and it has been bothering her since then making her run with a limp.  She is using ibuprofen.  In the past week she has noticed a couple nodules behind her left ear and 3 in the left axillary region and left lateral breast.  She has not had any fevers, chills, night sweats.  She has not felt sick.  She is up-to-date on mammograms.               ROS:   All other systems were reviewed and are negative.  Objective: Vital Signs: There were no vitals taken for this visit.  Physical Exam:  General:  Alert and oriented, in no acute distress. Pulm:  Breathing unlabored. Psy:  Normal mood, congruent affect. Skin: No rash HEENT: She has 2 small lymph nodes behind her left ear.  None in her neck. Female chaperone was present.  The left axilla has 3 small nodules which are freely mobile, they feel like lymph nodes.  There is no rash or scratch on her arm. Left knee: No effusion.  2+ patellofemoral crepitus with no pain on patellar compression.  Lachman's feels solid, no laxity with varus or valgus stress.  No palpable click with McMurray's, minimal medial joint line  tenderness.  No bony tenderness.   Imaging: No results found.  Assessment & Plan: 1.  Acute left knee pain, etiology uncertain.  Possibilities include stress fracture, meniscus injury, aggravation of DJD. -She will modify her activities until no longer limping.  If pain persist, we will order x-rays.  If negative for obvious stress fracture, we could contemplate a one-time cortisone injection.  If that did not help, then MRI scan.  2.  Left posterior auricular lymphadenopathy and left axillary lymphadenopathy -Etiology is uncertain.  We will treat with Keflex.  If symptoms persist, then lab evaluation with CBC with differential and sed rate, C-reactive protein.  Also possible ultrasound evaluation.     Procedures: No procedures performed        PMFS History: Patient Active Problem List   Diagnosis Date Noted  . Breast lump 02/10/2020  . Effects of high altitude 02/10/2020  . Headache 02/10/2020  . Menopausal symptom 02/10/2020  . Menopausal syndrome 02/10/2020  . Motion sickness 02/10/2020  . Osteoarthritis 02/10/2020  . Spasm of back muscles 02/10/2020  . Chronic tension-type headache, not intractable 02/10/2020  . Arthralgia of left temporomandibular joint 02/10/2020  . Family history of breast cancer 02/10/2020  .  Family history of heart disease 02/10/2020  . Hyperlipidemia 02/10/2020  . Migraine 04/01/2012   History reviewed. No pertinent past medical history.  Family History  Problem Relation Age of Onset  . Cancer Mother   . Breast cancer Mother   . Colon polyps Father   . Congestive Heart Failure Father   . Heart attack Father 93       Smoker  . Colon cancer Maternal Aunt   . Cancer Maternal Aunt   . Other Sister        Fibromuscular dysplasia    Past Surgical History:  Procedure Laterality Date  . ABDOMINAL HYSTERECTOMY     Social History   Occupational History  . Not on file  Tobacco Use  . Smoking status: Never Smoker  . Smokeless tobacco: Not  on file  Substance and Sexual Activity  . Alcohol use: Not Currently  . Drug use: Not Currently  . Sexual activity: Not on file

## 2021-04-04 ENCOUNTER — Encounter: Payer: Self-pay | Admitting: Family Medicine

## 2021-04-04 ENCOUNTER — Other Ambulatory Visit: Payer: Self-pay | Admitting: Family Medicine

## 2021-04-04 DIAGNOSIS — M25562 Pain in left knee: Secondary | ICD-10-CM

## 2021-04-04 NOTE — Progress Notes (Signed)
Patient is having ongoing left knee pain.  Will proceed with MRI.

## 2021-04-18 ENCOUNTER — Encounter: Payer: Self-pay | Admitting: *Deleted

## 2021-05-31 ENCOUNTER — Ambulatory Visit
Admission: RE | Admit: 2021-05-31 | Discharge: 2021-05-31 | Disposition: A | Discharge status: Home / Self Care | Payer: BC Managed Care – PPO | Source: Ambulatory Visit | Attending: Family Medicine | Admitting: Family Medicine

## 2021-05-31 ENCOUNTER — Other Ambulatory Visit: Payer: BC Managed Care – PPO

## 2021-05-31 ENCOUNTER — Other Ambulatory Visit: Payer: Self-pay

## 2021-05-31 ENCOUNTER — Telehealth: Payer: Self-pay | Admitting: Family Medicine

## 2021-05-31 DIAGNOSIS — M25562 Pain in left knee: Secondary | ICD-10-CM

## 2021-05-31 NOTE — Telephone Encounter (Signed)
Menisci and ligaments are intact.  Quite a bit of arthritis in the knee.

## 2021-06-06 ENCOUNTER — Telehealth: Payer: Self-pay | Admitting: Family Medicine

## 2021-06-06 ENCOUNTER — Ambulatory Visit (INDEPENDENT_AMBULATORY_CARE_PROVIDER_SITE_OTHER): Payer: BC Managed Care – PPO | Admitting: Family Medicine

## 2021-06-06 ENCOUNTER — Other Ambulatory Visit: Payer: Self-pay

## 2021-06-06 DIAGNOSIS — M25562 Pain in left knee: Secondary | ICD-10-CM

## 2021-06-06 NOTE — Telephone Encounter (Signed)
Requesting approval for left knee gel injections for OA. 

## 2021-06-06 NOTE — Telephone Encounter (Signed)
Noted  

## 2021-06-06 NOTE — Progress Notes (Signed)
   Office Visit Note   Patient: Mary Zimmerman           Date of Birth: [birth year]           MRN: [id number] Visit Date: 06/06/2021 Requested by: Lavada Mesi, MD [address] [place],  [place] [address] PCP: Lavada Mesi, MD  Subjective: Chief Complaint  Patient presents with  . Left Knee - Pain    Cortisone injection    HPI: She is here for follow-up left knee pain.  MRI showed degenerative change with a medial meniscus tear posterior horn.  Pain is better but not gone.              ROS:   All other systems were reviewed and are negative.  Objective: Vital Signs: There were no vitals taken for this visit.  Physical Exam:  General:  Alert and oriented, in no acute distress. Pulm:  Breathing unlabored. Psy:  Normal mood, congruent affect.  Left knee: Trace effusion, good range of motion.  Imaging: No results found.  Assessment & Plan: 1.  Left knee pain with underlying degenerative changes and meniscus tear -Elected to get approval for gel injections.  She will follow-up for the injection once approved.     Procedures: No procedures performed        PMFS History: Patient Active Problem List   Diagnosis Date Noted  . Breast lump 02/10/2020  . Effects of high altitude 02/10/2020  . Headache 02/10/2020  . Menopausal symptom 02/10/2020  . Menopausal syndrome 02/10/2020  . Motion sickness 02/10/2020  . Osteoarthritis 02/10/2020  . Spasm of back muscles 02/10/2020  . Chronic tension-type headache, not intractable 02/10/2020  . Arthralgia of left temporomandibular joint 02/10/2020  . Family history of breast cancer 02/10/2020  . Family history of heart disease 02/10/2020  . Hyperlipidemia 02/10/2020  . Migraine 04/01/2012   No past medical history on file.  Family History  Problem Relation Age of Onset  . Cancer Mother   . Breast cancer Mother   . Colon polyps Father   . Congestive Heart Failure Father   . Heart attack Father 41       Smoker  .  Colon cancer Maternal Aunt   . Cancer Maternal Aunt   . Other Sister        Fibromuscular dysplasia    Past Surgical History:  Procedure Laterality Date  . ABDOMINAL HYSTERECTOMY     Social History   Occupational History  . Not on file  Tobacco Use  . Smoking status: Never Smoker  . Smokeless tobacco: Not on file  Substance and Sexual Activity  . Alcohol use: Not Currently  . Drug use: Not Currently  . Sexual activity: Not on file

## 2021-06-23 ENCOUNTER — Telehealth: Payer: Self-pay

## 2021-06-23 NOTE — Telephone Encounter (Signed)
Noted! Thank you

## 2021-06-23 NOTE — Telephone Encounter (Signed)
The patient emailed Dr. Prince Rome directly, asking him if she has been approved for the gel injection. Please advise patient on the status.

## 2021-06-23 NOTE — Telephone Encounter (Signed)
VOB has been submitted for SynviscOne, left knee. Pending BV. 

## 2021-06-26 ENCOUNTER — Telehealth: Payer: Self-pay

## 2021-06-26 NOTE — Telephone Encounter (Signed)
PA required for SynviscOne, left knee. PA submitted online through Covermymeds. Pending PA# [id number]

## 2021-06-27 ENCOUNTER — Telehealth: Payer: Self-pay

## 2021-06-27 NOTE — Telephone Encounter (Signed)
Approved for SynviscOne, left knee. Buy & Bill Must meet deductible first Covered at 100% after deductible No Co-pay PA Approval# [id number] Valid 06/26/2021- 12/22/2021  Appt. 06/30/2021

## 2021-06-30 ENCOUNTER — Other Ambulatory Visit: Payer: Self-pay

## 2021-06-30 ENCOUNTER — Ambulatory Visit (INDEPENDENT_AMBULATORY_CARE_PROVIDER_SITE_OTHER): Payer: BC Managed Care – PPO | Admitting: Family Medicine

## 2021-06-30 DIAGNOSIS — M1712 Unilateral primary osteoarthritis, left knee: Secondary | ICD-10-CM

## 2021-06-30 NOTE — Progress Notes (Signed)
Subjective: She is here for planned Synvisc 1 injection for left knee osteoarthritis.  Incidentally she has a wart in the webspace of her left hand.    Objective: No significant effusion, no warmth or erythema.  Procedure: Left knee injection: After sterile prep with Betadine, injected 3 cc 1% lidocaine without epinephrine and Synvisc-1 from lateral midpatellar approach.  Plan:  For wart, try compound w.  Cryo if persists.   Return as needed for knee.

## 2021-07-07 ENCOUNTER — Encounter: Payer: Self-pay | Admitting: Family Medicine

## 2021-12-14 ENCOUNTER — Other Ambulatory Visit: Payer: Self-pay

## 2021-12-14 ENCOUNTER — Encounter (HOSPITAL_BASED_OUTPATIENT_CLINIC_OR_DEPARTMENT_OTHER): Payer: Self-pay | Admitting: Emergency Medicine

## 2021-12-14 ENCOUNTER — Emergency Department (HOSPITAL_BASED_OUTPATIENT_CLINIC_OR_DEPARTMENT_OTHER)
Admission: EM | Admit: 2021-12-14 | Discharge: 2021-12-14 | Disposition: A | Discharge status: Home / Self Care | Payer: BC Managed Care – PPO | Attending: Emergency Medicine | Admitting: Emergency Medicine

## 2021-12-14 DIAGNOSIS — T7840XA Allergy, unspecified, initial encounter: Secondary | ICD-10-CM | POA: Diagnosis present

## 2021-12-14 DIAGNOSIS — Z20822 Contact with and (suspected) exposure to covid-19: Secondary | ICD-10-CM | POA: Insufficient documentation

## 2021-12-14 DIAGNOSIS — L500 Allergic urticaria: Secondary | ICD-10-CM | POA: Diagnosis not present

## 2021-12-14 HISTORY — DX: Hyperlipidemia, unspecified: E78.5

## 2021-12-14 LAB — RESP PANEL BY RT-PCR (FLU A&B, COVID) ARPGX2
Influenza A by PCR: NEGATIVE
Influenza B by PCR: NEGATIVE
SARS Coronavirus 2 by RT PCR: NEGATIVE

## 2021-12-14 MED ORDER — FAMOTIDINE IN NACL 20-0.9 MG/50ML-% IV SOLN
20.0000 mg | Freq: Once | INTRAVENOUS | Status: AC
  Administered 2021-12-14: 20 mg via INTRAVENOUS
  Filled 2021-12-14: qty 50

## 2021-12-14 MED ORDER — EPINEPHRINE 0.3 MG/0.3ML IJ SOAJ: 0.3000 mg | INTRAMUSCULAR | 0 refills | Status: AC | PRN

## 2021-12-14 MED ORDER — DIPHENHYDRAMINE HCL 50 MG/ML IJ SOLN
25.0000 mg | Freq: Once | INTRAMUSCULAR | Status: AC
  Administered 2021-12-14: 25 mg via INTRAVENOUS
  Filled 2021-12-14: qty 1

## 2021-12-14 MED ORDER — METHYLPREDNISOLONE SODIUM SUCC 125 MG IJ SOLR
125.0000 mg | Freq: Once | INTRAMUSCULAR | Status: AC
  Administered 2021-12-14: 125 mg via INTRAVENOUS
  Filled 2021-12-14: qty 2

## 2021-12-14 MED ORDER — SODIUM CHLORIDE 0.9 % IV BOLUS
1000.0000 mL | Freq: Once | INTRAVENOUS | Status: AC
  Administered 2021-12-14: 1000 mL via INTRAVENOUS

## 2021-12-14 MED ORDER — PREDNISONE 20 MG PO TABS: 40.0000 mg | ORAL_TABLET | Freq: Every day | ORAL | 0 refills | Status: AC

## 2021-12-14 MED ORDER — DIPHENHYDRAMINE HCL 25 MG PO TABS: 25.0000 mg | ORAL_TABLET | Freq: Four times a day (QID) | ORAL | 0 refills | Status: AC | PRN

## 2021-12-14 MED ORDER — TRIAMCINOLONE ACETONIDE 0.5 % EX OINT: 1.0000 "application " | TOPICAL_OINTMENT | Freq: Two times a day (BID) | CUTANEOUS | 0 refills | Status: AC

## 2021-12-14 NOTE — Discharge Instructions (Addendum)
Avoid applying kenalog to your face, groin, or open wounds.

## 2021-12-14 NOTE — ED Triage Notes (Signed)
Pt arrives pov, c/o swollen lymph nodes x 2 weeks, itchy rash over body x 2 days. Pt denies shob, able to speak in complete sentences. Reports tingling in throat and lips. Denies change in detergents or new foods. Took 50mg  benadryl at 0430 today

## 2021-12-14 NOTE — ED Provider Notes (Signed)
[facility] EMERGENCY DEPT Provider Note   CSN: [id number] Arrival date & time: 12/14/21  0932     History Chief Complaint  Patient presents with   Allergic Reaction    Mary Zimmerman is a 55 y.o. female.  This is a 55 y.o. female with significant medical history as below, including HLD who presents to the ED with complaint of pos allergic reaction.  Patient reports she noticed diffuse itching to her extremities over the past 24 hours.  Worsened in her waist area, upper extremities.  She has been taking Benadryl over the past 24 hours with mild improvement to her symptoms.  No known allergens or exposures.  No recent changes to diet, personal hygiene products or environment.  No history of anaphylaxis in the past.  No recent travel.  No recent insect bites or exposure.  No chest pain, dyspnea, fevers, chills, nausea, vomiting, headaches, numbness or weakness.  The history is provided by the patient and the spouse. No language interpreter was used.  Allergic Reaction Presenting symptoms: rash   Presenting symptoms: no difficulty swallowing       Past Medical History:  Diagnosis Date   Hyperlipidemia     Patient Active Problem List   Diagnosis Date Noted   Breast lump 02/10/2020   Effects of high altitude 02/10/2020   Headache 02/10/2020   Menopausal symptom 02/10/2020   Menopausal syndrome 02/10/2020   Motion sickness 02/10/2020   Osteoarthritis 02/10/2020   Spasm of back muscles 02/10/2020   Chronic tension-type headache, not intractable 02/10/2020   Arthralgia of left temporomandibular joint 02/10/2020   Family history of breast cancer 02/10/2020   Family history of heart disease 02/10/2020   Hyperlipidemia 02/10/2020   Migraine 04/01/2012    Past Surgical History:  Procedure Laterality Date   ABDOMINAL HYSTERECTOMY     TMJ ARTHROSCOPY       OB History   No obstetric history on file.     Family History  Problem Relation Age of Onset    Cancer Mother    Breast cancer Mother    Colon polyps Father    Congestive Heart Failure Father    Heart attack Father 37       Smoker   Colon cancer Maternal Aunt    Cancer Maternal Aunt    Other Sister        Fibromuscular dysplasia    Social History   Tobacco Use   Smoking status: Never  Substance Use Topics   Alcohol use: Not Currently   Drug use: Not Currently    Home Medications Prior to Admission medications   Medication Sig Start Date End Date Taking? Authorizing Provider  diphenhydrAMINE (BENADRYL) 25 MG tablet Take 1 tablet (25 mg total) by mouth every 6 (six) hours as needed for up to 5 days. 12/14/21 12/19/21 Yes Tanda Rockers A, DO  EPINEPHrine 0.3 mg/0.3 mL IJ SOAJ injection Inject 0.3 mg into the muscle as needed for anaphylaxis. 12/14/21  Yes Tanda Rockers A, DO  predniSONE (DELTASONE) 20 MG tablet Take 2 tablets (40 mg total) by mouth daily for 7 days. 12/14/21 12/21/21 Yes Tanda Rockers A, DO  triamcinolone ointment (KENALOG) 0.5 % Apply 1 application topically 2 (two) times daily for 5 days. 12/14/21 12/19/21 Yes Tanda Rockers A, DO  Ascorbic Acid (VITAMIN C ADULT GUMMIES PO) Take 500 mg by mouth.    [provider]  BIOTIN PO Take by mouth 2 (two) times daily.    [provider]  celecoxib (CELEBREX) 200 MG capsule Take 1 capsule (200 mg total) by mouth 2 (two) times daily as needed. 03/08/21   Hilts, Casimiro Needle, MD  Cyanocobalamin (BL VITAMIN B-12 PO) Take by mouth daily.    [provider]  cyclobenzaprine (FLEXERIL) 10 MG tablet cyclobenzaprine 10 mg tablet  TAKE 1 TABLET BY MOUTH AT BEDTIME FOR MUSCLE SPASM IN NECK    [provider]  diclofenac (VOLTAREN) 75 MG EC tablet TAKE 1 TABLET(75 MG) BY MOUTH TWICE DAILY AS NEEDED 09/28/20   Hilts, Casimiro Needle, MD  estradiol (ESTRACE) 1 MG tablet estradiol 1 mg tablet  TAKE 1 TABLET BY MOUTH EVERY DAY    [provider]  Probiotic Product (PROBIOTIC DAILY PO) Take by mouth daily.     [provider]    Allergies    Penicillins  Review of Systems   Review of Systems  Constitutional:  Negative for activity change and fever.  HENT:  Positive for congestion. Negative for facial swelling and trouble swallowing.   Eyes:  Negative for discharge and redness.  Respiratory:  Negative for cough and shortness of breath.   Cardiovascular:  Negative for chest pain and palpitations.  Gastrointestinal:  Negative for abdominal pain and nausea.  Genitourinary:  Negative for dysuria and flank pain.  Musculoskeletal:  Negative for back pain and gait problem.  Skin:  Positive for rash. Negative for pallor.  Neurological:  Negative for syncope and headaches.   Physical Exam Updated Vital Signs BP 123/70 (BP Location: Right Arm)    Pulse 77    Temp 98.5 F (36.9 C) (Oral)    Resp 18    Ht  (1.727 m)    Wt 63.5 kg    SpO2 100%    BMI 21.29 kg/m   Physical Exam Vitals and nursing note reviewed.  Constitutional:      General: She is not in acute distress.    Appearance: Normal appearance. She is normal weight. She is not ill-appearing, toxic-appearing or diaphoretic.  HENT:     Head: Normocephalic and atraumatic.     Right Ear: External ear normal.     Left Ear: External ear normal.     Nose: Nose normal.     Mouth/Throat:     Mouth: Mucous membranes are moist.  Eyes:     General: No scleral icterus.       Right eye: No discharge.        Left eye: No discharge.  Cardiovascular:     Rate and Rhythm: Normal rate and regular rhythm.     Pulses: Normal pulses.     Heart sounds: Normal heart sounds.  Pulmonary:     Effort: Pulmonary effort is normal. No respiratory distress.     Breath sounds: Normal breath sounds.  Abdominal:     General: Abdomen is flat.     Tenderness: There is no abdominal tenderness.  Musculoskeletal:        General: Normal range of motion.     Cervical back: Normal range of motion.     Right lower leg: No edema.     Left lower leg: No  edema.  Skin:    General: Skin is warm and dry.     Capillary Refill: Capillary refill takes less than 2 seconds.     Comments: Urticaria diffusely.  No angioedema, no stridor, no drooling, no wheezing, no trismus.  Neurological:     Mental Status: She is alert.  Psychiatric:  Mood and Affect: Mood normal.        Behavior: Behavior normal.    ED Results / Procedures / Treatments   Labs (all labs ordered are listed, but only abnormal results are displayed) Labs Reviewed  RESP PANEL BY RT-PCR (FLU A&B, COVID) [id number]    EKG None  Radiology No results found.  Procedures Procedures   Medications Ordered in ED Medications  famotidine (PEPCID) IVPB 20 mg premix (0 mg Intravenous Stopped 12/14/21 0908)  sodium chloride 0.9 % bolus 1,000 mL (0 mLs Intravenous Stopped 12/14/21 0944)  methylPREDNISolone sodium succinate (SOLU-MEDROL) 125 mg/2 mL injection 125 mg (125 mg Intravenous Given 12/14/21 0837)  diphenhydrAMINE (BENADRYL) injection 25 mg (25 mg Intravenous Given 12/14/21 1010)    ED Course  I have reviewed the triage vital signs and the nursing notes.  Pertinent labs & imaging results that were available during my care of the patient were reviewed by me and considered in my medical decision making (see chart for details).    MDM Rules/Calculators/A&P                           CC: allergic reaction? rash  This patient complains of above; this involves an extensive number of treatment options and is a complaint that carries with it a high risk of complications and morbidity. Vital signs were reviewed. Serious etiologies considered.  Record review:  Previous records obtained and reviewed   Additional history obtained from spouse  Patient received laboratory evaluation as an outpatient yesterday, these labs reviewed by myself is positive for eosinophilia.  Patient had a photo of the lab results on her cell phone that was sent to her by her  PCP.   Management: Give IV fluids, steroids, Pepcid.  She already took 50mg  Benadryl around 4:30 AM this morning.  Reassessment:  Pt feeling much better. No angio edema, no stridor or hypoxia, no DIB.  Suspicion for allergic reaction, irritant dermatitis.  Discussed supportive care at home including avoidance of known triggers, using unscented soaps and lotions, topical steroid cream, oral antihistamine, oral steroids.  F/u with allergy. Give epi pen for home.   The patient improved significantly and was discharged in stable condition. Detailed discussions were had with the patient regarding current findings, and need for close f/u with PCP or on call doctor. The patient has been instructed to return immediately if the symptoms worsen in any way for re-evaluation. Patient verbalized understanding and is in agreement with current care plan. All questions answered prior to discharge.           This chart was dictated using voice recognition software.  Despite best efforts to proofread,  errors can occur which can change the documentation meaning.  Final Clinical Impression(s) / ED Diagnoses Final diagnoses:  Allergic reaction, initial encounter    Rx / DC Orders ED Discharge Orders          Ordered    predniSONE (DELTASONE) 20 MG tablet  Daily        12/14/21 1058    triamcinolone ointment (KENALOG) 0.5 %  2 times daily        12/14/21 1058    diphenhydrAMINE (BENADRYL) 25 MG tablet  Every 6 hours PRN        12/14/21 1058    EPINEPHrine 0.3 mg/0.3 mL IJ SOAJ injection  As needed        12/14/21 1100  Tanda Rockers A, DO 12/14/21 1101

## 2021-12-15 ENCOUNTER — Telehealth: Payer: Self-pay | Admitting: Family Medicine

## 2021-12-15 NOTE — Telephone Encounter (Signed)
Medical records requested from [facility]. I emailed records.

## 2021-12-30 IMAGING — MR MR KNEE*L* W/O CM
4 of 7 series · 21 of 40 positions shown · non-contrast
Comparison: None.

CLINICAL DATA: Chronic medial knee pain. Injury 2 weeks ago.
History of ACL repair in 2669

EXAM:
MRI OF THE LEFT KNEE WITHOUT CONTRAST
TECHNIQUE: Multiplanar, multisequence MR imaging of the knee was performed. No
intravenous contrast was administered.

[Series 3: T2 fat-sat · axial · 4.0mm · 0.50mm/px · z∈[-80,+40]mm · 4 of 25 slices shown]
[im 1/25]
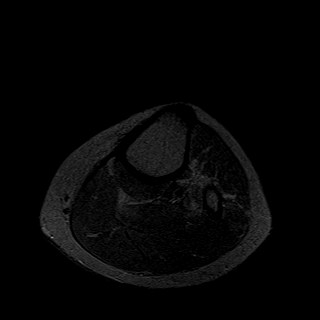
[im 5/25]
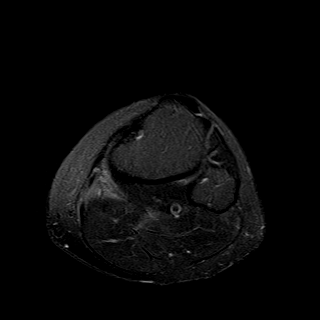
[im 15/25]
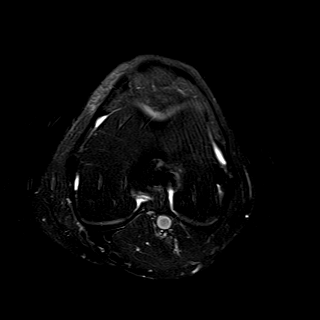
[im 25/25]
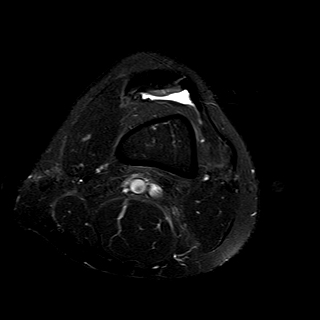

[Series 7: PD fat-sat · sagittal · 3.0mm · 0.29mm/px · 7 of 27 slices shown (1 of 3)]
[im 1/27]
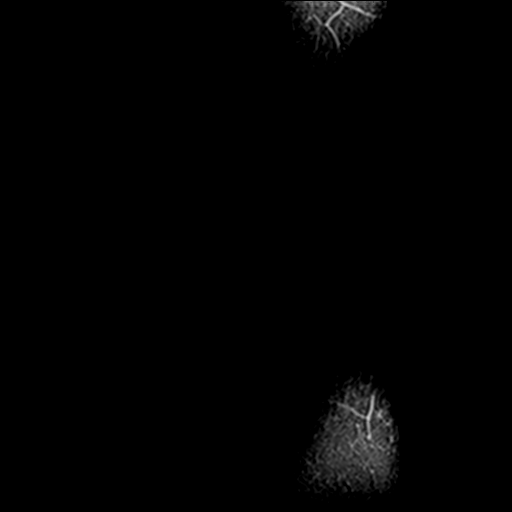
[im 5/27]
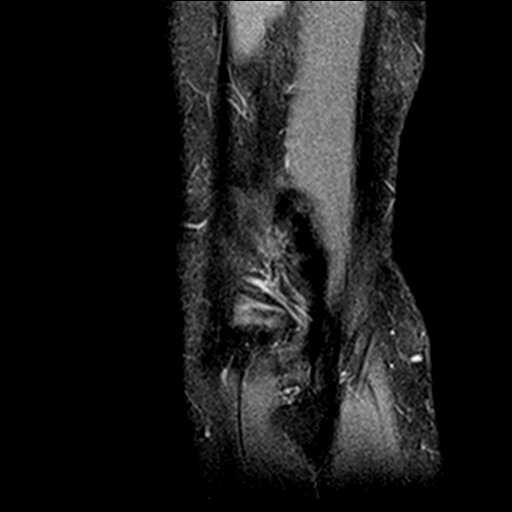
[im 9/27]
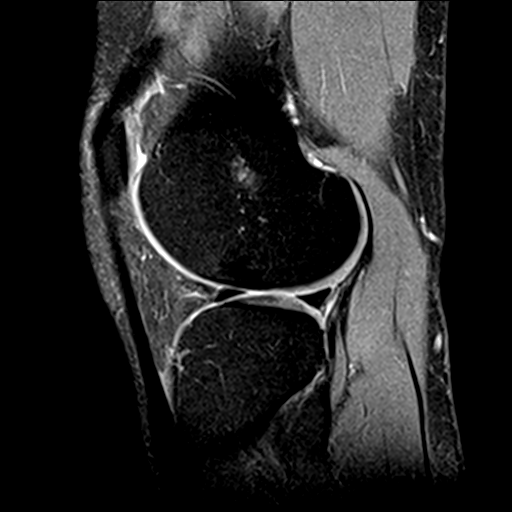
[im 14/27]
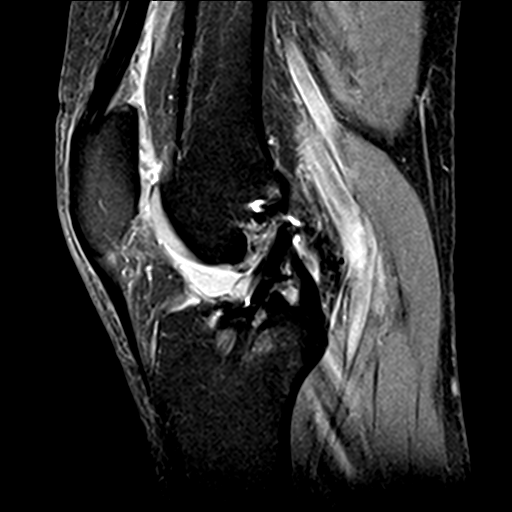
[im 18/27]
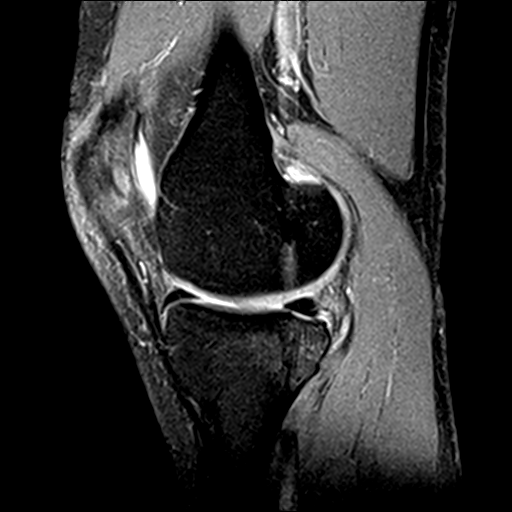
[im 22/27]
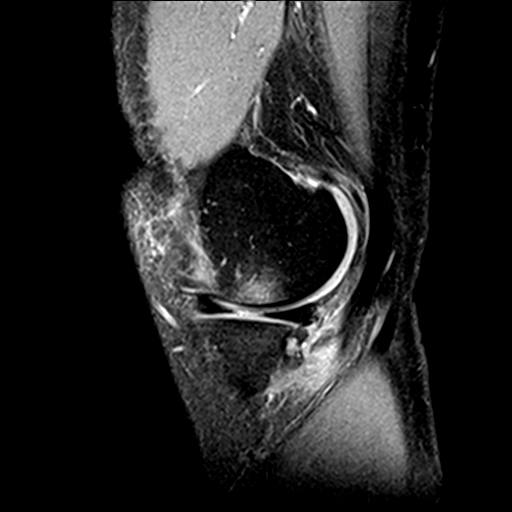
[im 27/27]
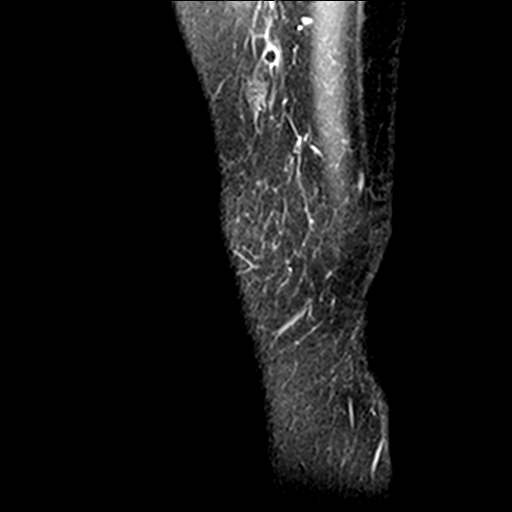

[Series 8: PD fat-sat · coronal · 3.0mm · 0.29mm/px · 7 of 28 slices shown (2 of 3)]
[im 1/28]
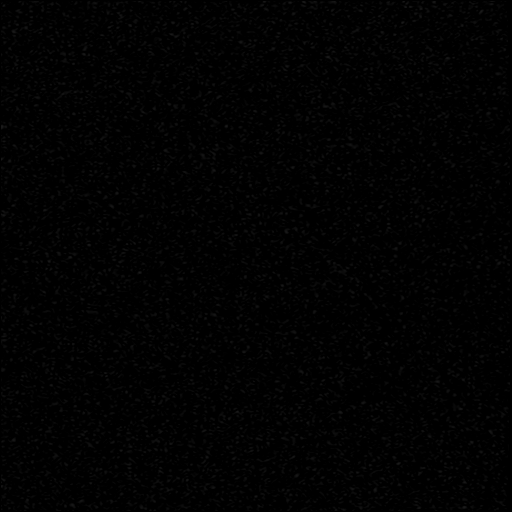
[im 5/28]
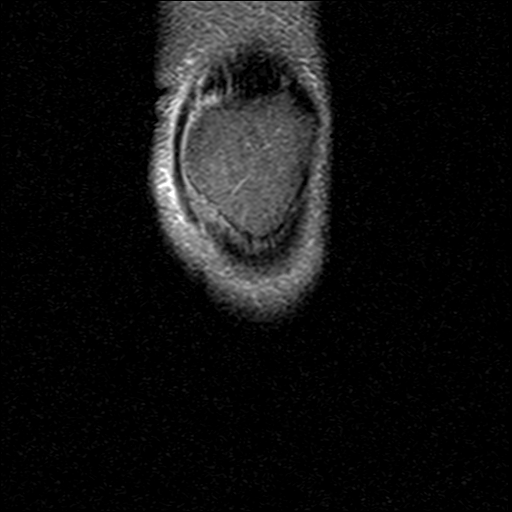
[im 10/28]
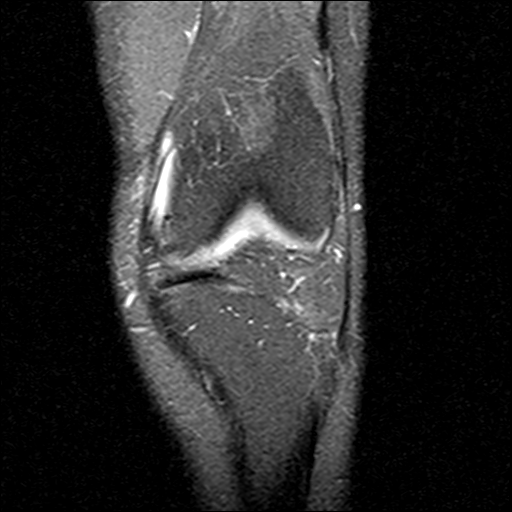
[im 14/28]
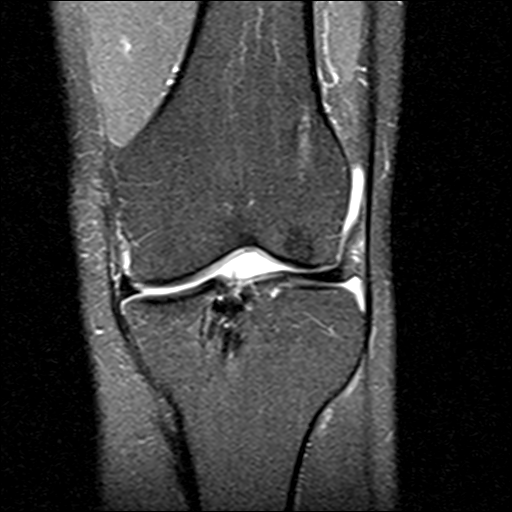
[im 19/28]
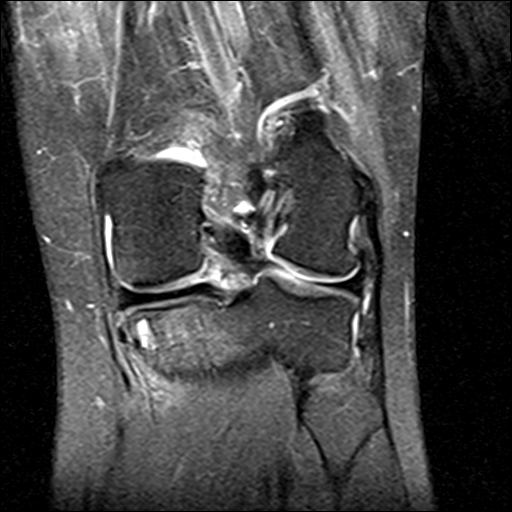
[im 23/28]
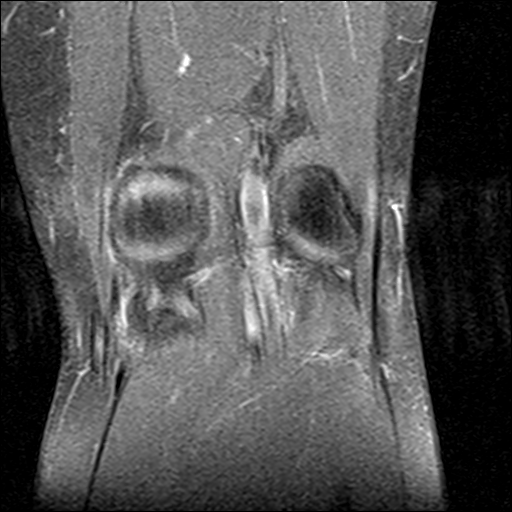
[im 28/28]
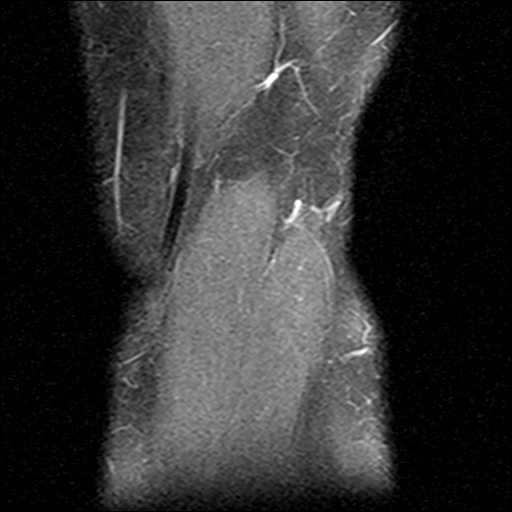

[Series 9: PD fat-sat · coronal · 2.0mm · 0.29mm/px · 3 of 11 slices shown (3 of 3)]
[im 1/11]
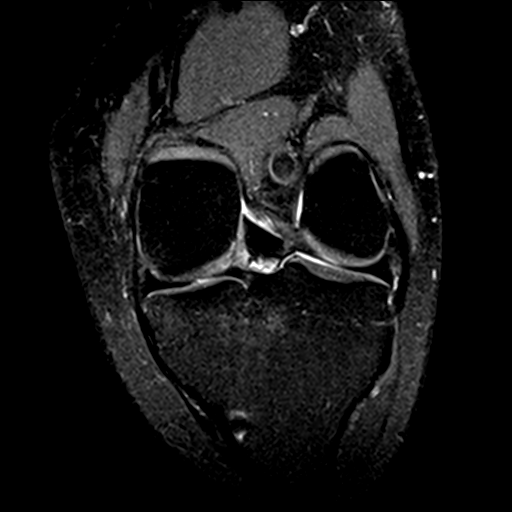
[im 6/11]
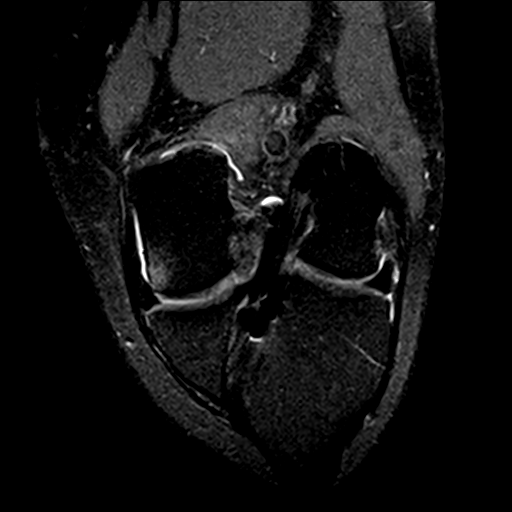
[im 11/11]
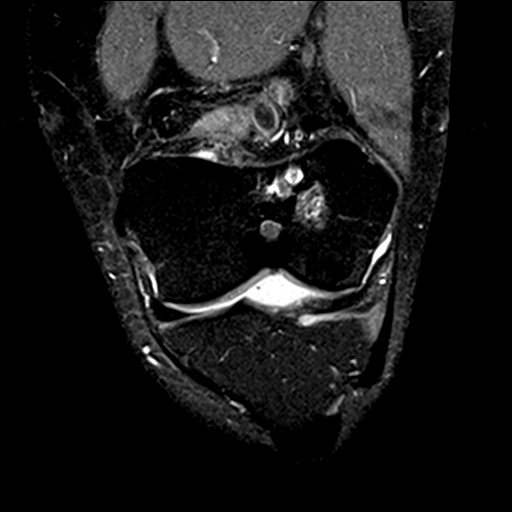

[21 of 40 positions shown; findings below may reference images not displayed]

FINDINGS: MENISCI

Medial meniscus:  Intact.

Lateral meniscus:  Intact.

LIGAMENTS

Cruciates:  Prior ACL reconstruction with intact graft.  Intact PCL.

Collaterals: Medial collateral ligament is intact. Lateral
collateral ligament complex is intact.

CARTILAGE

Patellofemoral: Suspect mild chondral surface irregularity of the
patella, evaluation of which is somewhat degraded by artifact. No
focal cartilage defect.

Medial: High-grade cartilage loss along the posterior aspect of the
medial tibial plateau underlying the medial meniscal posterior horn.
Medial femoral condyle articular cartilage intact.

Lateral:  No chondral defect.

Joint: Small knee joint effusion. Fat pads within normal limits. No
arthrofibrosis.

Popliteal Fossa:  No Baker cyst. Intact popliteus tendon.

Extensor Mechanism:  Intact quadriceps tendon and patellar tendon.

Bones: Postsurgical changes within the distal femur and proximal
tibia related to ACL reconstruction. No cystic change within the
osseous tunnels. Prominent subchondral cystic change within the
posterior aspect of the medial tibial plateau with slight osseous
fragmentation (series 7, image 21). There is mild bone marrow edema
throughout the adjacent medial tibial plateau. Mild subchondral
marrow edema at the peripheral aspect of the medial femoral condyle.
No evidence of acute fracture. No suspicious marrow replacing bone
lesion.

Other: None.
IMPRESSION: 1. Prior ACL reconstruction with intact graft.
2. High-grade cartilage loss along the posterior aspect of the
medial tibial plateau underlying the medial meniscal posterior horn.
3. Prominent subchondral cystic change within the medial tibial
plateau posteriorly with chronic appearing osseous fragmentation.
Mild bone marrow edema throughout the adjacent medial tibial
plateau, likely reactive.
4. Small knee joint effusion.
5. Intact menisci. Intact collateral and cruciate ligaments.

## 2022-11-05 ENCOUNTER — Encounter: Payer: Self-pay | Admitting: Obstetrics & Gynecology

## 2022-11-05 DIAGNOSIS — Z1231 Encounter for screening mammogram for malignant neoplasm of breast: Secondary | ICD-10-CM

## 2022-11-07 ENCOUNTER — Other Ambulatory Visit: Payer: Self-pay | Admitting: Obstetrics & Gynecology

## 2022-11-07 DIAGNOSIS — N6310 Unspecified lump in the right breast, unspecified quadrant: Secondary | ICD-10-CM

## 2022-11-20 ENCOUNTER — Other Ambulatory Visit: Payer: Self-pay | Admitting: Obstetrics & Gynecology

## 2022-11-20 ENCOUNTER — Ambulatory Visit
Admission: RE | Admit: 2022-11-20 | Discharge: 2022-11-20 | Disposition: A | Discharge status: Home / Self Care | Payer: BC Managed Care – PPO | Source: Ambulatory Visit | Attending: Obstetrics & Gynecology | Admitting: Obstetrics & Gynecology

## 2022-11-20 DIAGNOSIS — N6310 Unspecified lump in the right breast, unspecified quadrant: Secondary | ICD-10-CM

## 2023-05-01 ENCOUNTER — Ambulatory Visit (HOSPITAL_COMMUNITY): Payer: BC Managed Care – PPO

## 2023-05-01 LAB — COLOGUARD: COLOGUARD: NEGATIVE

## 2023-05-08 ENCOUNTER — Other Ambulatory Visit: Payer: Self-pay

## 2023-05-08 ENCOUNTER — Other Ambulatory Visit (HOSPITAL_COMMUNITY): Payer: Self-pay | Admitting: Family Medicine

## 2023-05-08 ENCOUNTER — Ambulatory Visit (HOSPITAL_COMMUNITY)
Admission: RE | Admit: 2023-05-08 | Discharge: 2023-05-08 | Disposition: A | Discharge status: Home / Self Care | Payer: BC Managed Care – PPO | Source: Ambulatory Visit | Attending: Vascular Surgery | Admitting: Vascular Surgery

## 2023-05-08 DIAGNOSIS — I773 Arterial fibromuscular dysplasia: Secondary | ICD-10-CM

## 2023-05-16 ENCOUNTER — Encounter (HOSPITAL_COMMUNITY): Payer: Self-pay | Admitting: Cardiology

## 2023-05-21 ENCOUNTER — Other Ambulatory Visit: Payer: BC Managed Care – PPO

## 2023-08-07 ENCOUNTER — Other Ambulatory Visit: Payer: Self-pay | Admitting: Family Medicine

## 2023-08-07 ENCOUNTER — Ambulatory Visit
Admission: RE | Admit: 2023-08-07 | Discharge: 2023-08-07 | Disposition: A | Discharge status: Home / Self Care | Payer: BC Managed Care – PPO | Source: Ambulatory Visit | Attending: Family Medicine | Admitting: Family Medicine

## 2023-08-07 DIAGNOSIS — T148XXA Other injury of unspecified body region, initial encounter: Secondary | ICD-10-CM

## 2023-08-07 DIAGNOSIS — M79671 Pain in right foot: Secondary | ICD-10-CM

## 2024-06-23 ENCOUNTER — Other Ambulatory Visit: Payer: Self-pay | Admitting: Family Medicine

## 2024-06-23 ENCOUNTER — Encounter: Payer: Self-pay | Admitting: Family Medicine

## 2024-06-23 DIAGNOSIS — R1011 Right upper quadrant pain: Secondary | ICD-10-CM

## 2024-06-25 ENCOUNTER — Ambulatory Visit
Admission: RE | Admit: 2024-06-25 | Discharge: 2024-06-25 | Disposition: A | Discharge status: Home / Self Care | Source: Ambulatory Visit | Attending: Family Medicine | Admitting: Family Medicine

## 2024-06-25 DIAGNOSIS — R1011 Right upper quadrant pain: Secondary | ICD-10-CM

## 2025-01-15 ENCOUNTER — Encounter: Payer: Self-pay | Admitting: Family Medicine

## 2025-01-15 DIAGNOSIS — Z1231 Encounter for screening mammogram for malignant neoplasm of breast: Secondary | ICD-10-CM

## 2025-01-19 ENCOUNTER — Encounter: Payer: Self-pay | Admitting: Family Medicine

## 2025-01-19 DIAGNOSIS — Z1231 Encounter for screening mammogram for malignant neoplasm of breast: Secondary | ICD-10-CM

## 2025-01-20 ENCOUNTER — Other Ambulatory Visit: Payer: Self-pay | Admitting: Family Medicine

## 2025-01-20 DIAGNOSIS — N6310 Unspecified lump in the right breast, unspecified quadrant: Secondary | ICD-10-CM

## 2025-01-20 DIAGNOSIS — Z1231 Encounter for screening mammogram for malignant neoplasm of breast: Secondary | ICD-10-CM
